# Patient Record
Sex: Male | Born: 2005 | Hispanic: Yes | Marital: Single | Smoking: Never smoker
Health system: Southern US, Community
[De-identification: ages and names within clinical notes are randomized; demographics above are authoritative.]

## PROBLEM LIST (undated history)

## (undated) DIAGNOSIS — J45909 Unspecified asthma, uncomplicated: Secondary | ICD-10-CM

## (undated) DIAGNOSIS — T7840XA Allergy, unspecified, initial encounter: Secondary | ICD-10-CM

## (undated) DIAGNOSIS — H6690 Otitis media, unspecified, unspecified ear: Secondary | ICD-10-CM

## (undated) HISTORY — DX: Allergy, unspecified, initial encounter: T78.40XA

## (undated) HISTORY — PX: TYMPANOSTOMY TUBE PLACEMENT: SHX32

## (undated) HISTORY — DX: Otitis media, unspecified, unspecified ear: H66.90

## (undated) HISTORY — PX: TONSILLECTOMY AND ADENOIDECTOMY: SHX28

## (undated) HISTORY — DX: Unspecified asthma, uncomplicated: J45.909

---
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
## 2025-07-15 ENCOUNTER — Encounter: Admitting: Family Medicine
